# Patient Record
Sex: Female | Born: 1985 | Race: Black or African American | Hispanic: No | Marital: Single
Health system: Southern US, Community
[De-identification: ages and names within clinical notes are randomized; demographics above are authoritative.]

## PROBLEM LIST (undated history)

## (undated) DIAGNOSIS — J302 Other seasonal allergic rhinitis: Secondary | ICD-10-CM

---

## 2015-08-21 ENCOUNTER — Encounter (HOSPITAL_COMMUNITY): Payer: Self-pay | Admitting: *Deleted

## 2015-08-21 ENCOUNTER — Emergency Department (HOSPITAL_COMMUNITY): Payer: BC Managed Care – PPO

## 2015-08-21 ENCOUNTER — Emergency Department (HOSPITAL_COMMUNITY)
Admission: EM | Admit: 2015-08-21 | Discharge: 2015-08-21 | Disposition: A | Discharge status: Home / Self Care | Payer: BC Managed Care – PPO | Attending: Emergency Medicine | Admitting: Emergency Medicine

## 2015-08-21 DIAGNOSIS — R109 Unspecified abdominal pain: Secondary | ICD-10-CM | POA: Diagnosis present

## 2015-08-21 DIAGNOSIS — N200 Calculus of kidney: Secondary | ICD-10-CM | POA: Insufficient documentation

## 2015-08-21 DIAGNOSIS — N23 Unspecified renal colic: Secondary | ICD-10-CM | POA: Diagnosis not present

## 2015-08-21 DIAGNOSIS — Z79899 Other long term (current) drug therapy: Secondary | ICD-10-CM | POA: Insufficient documentation

## 2015-08-21 DIAGNOSIS — R112 Nausea with vomiting, unspecified: Secondary | ICD-10-CM | POA: Diagnosis not present

## 2015-08-21 HISTORY — DX: Other seasonal allergic rhinitis: J30.2

## 2015-08-21 LAB — BASIC METABOLIC PANEL
Anion gap: 5 (ref 5–15)
BUN: 11 mg/dL (ref 6–20)
CALCIUM: 8.3 mg/dL — AB (ref 8.9–10.3)
CHLORIDE: 108 mmol/L (ref 101–111)
CO2: 22 mmol/L (ref 22–32)
CREATININE: 0.78 mg/dL (ref 0.44–1.00)
GFR calc non Af Amer: >60 mL/min (ref >60)
Glucose, Bld: 122 mg/dL — ABNORMAL HIGH (ref 65–99)
Potassium: 3.9 mmol/L (ref 3.5–5.1)
Sodium: 135 mmol/L (ref 135–145)

## 2015-08-21 LAB — CBC WITH DIFFERENTIAL/PLATELET
BASOS ABS: 0 10*3/uL (ref 0.0–0.1)
Basophils Relative: 0 %
EOS PCT: 0 %
Eosinophils Absolute: 0 10*3/uL (ref 0.0–0.7)
HCT: 40.5 % (ref 36.0–46.0)
Hemoglobin: 13.8 g/dL (ref 12.0–15.0)
LYMPHS PCT: 8 %
Lymphs Abs: 0.6 10*3/uL — ABNORMAL LOW (ref 0.7–4.0)
MCH: 30.2 pg (ref 26.0–34.0)
MCHC: 34.1 g/dL (ref 30.0–36.0)
MCV: 88.6 fL (ref 78.0–100.0)
Monocytes Absolute: 0.3 10*3/uL (ref 0.1–1.0)
Monocytes Relative: 4 %
Neutro Abs: 6.4 10*3/uL (ref 1.7–7.7)
Neutrophils Relative %: 88 %
Platelets: 282 10*3/uL (ref 150–400)
RBC: 4.57 MIL/uL (ref 3.87–5.11)
RDW: 12.8 % (ref 11.5–15.5)
WBC: 7.3 10*3/uL (ref 4.0–10.5)

## 2015-08-21 LAB — URINALYSIS, ROUTINE W REFLEX MICROSCOPIC
BILIRUBIN URINE: NEGATIVE
GLUCOSE, UA: NEGATIVE mg/dL
KETONES UR: NEGATIVE mg/dL
Leukocytes, UA: NEGATIVE
Nitrite: NEGATIVE
PH: 5.5 (ref 5.0–8.0)
Protein, ur: NEGATIVE mg/dL
SPECIFIC GRAVITY, URINE: 1.019 (ref 1.005–1.030)

## 2015-08-21 LAB — URINE MICROSCOPIC-ADD ON: WBC, UA: NONE SEEN WBC/hpf (ref 0–5)

## 2015-08-21 LAB — I-STAT BETA HCG BLOOD, ED (MC, WL, AP ONLY): I-stat hCG, quantitative: <5 m[IU]/mL (ref <5)

## 2015-08-21 MED ORDER — OXYCODONE-ACETAMINOPHEN 5-325 MG PO TABS: 1.0000 | ORAL_TABLET | ORAL | 0 refills | Status: AC | PRN

## 2015-08-21 MED ORDER — MORPHINE SULFATE (PF) 4 MG/ML IV SOLN
4.0000 mg | Freq: Once | INTRAVENOUS | Status: AC
  Administered 2015-08-21: 4 mg via INTRAVENOUS
  Filled 2015-08-21: qty 1

## 2015-08-21 MED ORDER — NAPROXEN 500 MG PO TABS: 500.0000 mg | ORAL_TABLET | Freq: Two times a day (BID) | ORAL | 0 refills | Status: AC | PRN

## 2015-08-21 MED ORDER — KETOROLAC TROMETHAMINE 15 MG/ML IJ SOLN
15.0000 mg | Freq: Once | INTRAMUSCULAR | Status: AC
  Administered 2015-08-21: 15 mg via INTRAVENOUS
  Filled 2015-08-21: qty 1

## 2015-08-21 MED ORDER — SODIUM CHLORIDE 0.9 % IV BOLUS (SEPSIS)
1000.0000 mL | Freq: Once | INTRAVENOUS | Status: AC
  Administered 2015-08-21: 1000 mL via INTRAVENOUS

## 2015-08-21 MED ORDER — ONDANSETRON HCL 4 MG PO TABS: 4.0000 mg | ORAL_TABLET | Freq: Three times a day (TID) | ORAL | 0 refills | Status: AC | PRN

## 2015-08-21 MED ORDER — OXYCODONE-ACETAMINOPHEN 5-325 MG PO TABS: 2.0000 | ORAL_TABLET | Freq: Once | ORAL | Status: DC

## 2015-08-21 MED ORDER — ONDANSETRON HCL 4 MG/2ML IJ SOLN
4.0000 mg | Freq: Once | INTRAMUSCULAR | Status: AC
  Administered 2015-08-21: 4 mg via INTRAVENOUS
  Filled 2015-08-21: qty 2

## 2015-08-21 MED ORDER — MORPHINE SULFATE (PF) 4 MG/ML IV SOLN
4.0000 mg | Freq: Once | INTRAVENOUS | Status: AC
Start: 2015-08-21 — End: 2015-08-21
  Administered 2015-08-21: 4 mg via INTRAVENOUS
  Filled 2015-08-21: qty 1

## 2015-08-21 NOTE — ED Notes (Signed)
Patient c/o acute onset left flank pain which began this morning.  Patient also has N/V with pain and hematuria.  Patient has hx of one previous kidney stone with similar pain and N/V.  No CVA tenderness, abdomen soft and non-tender to palpation.  Patient appears adequately hydrated.

## 2015-08-21 NOTE — ED Notes (Signed)
Only 1 dose of 4 mg Morphine given.  Morphine syringe tip blew off as med was being pushed and was wasted on bed for first administration.

## 2015-08-21 NOTE — ED Triage Notes (Signed)
Per EMS - patient comes from [facility] on [address] with c/o left flank pain of acute onset at 6 am today.  Patient also complains of intermittent dizziness and hematuria.  Patient has had some nausea, but no vomiting or diarrhea.  Patient is also on her menstrual cycle.  Patient received 4 mg of Zofran en route and 100 mcg of Fentanyl en route. Patient's vitals 118/74, HR 45-50 (patient is athletic), 99% on RA.  CBG 143.

## 2015-08-21 NOTE — ED Notes (Signed)
Bed: GD92 Expected date:  Expected time:  Means of arrival:  Comments: 30 yo flank pain

## 2015-08-21 NOTE — ED Provider Notes (Signed)
[facility]-EMERGENCY DEPT Provider Note   CSN: [id number] Arrival date & time: 08/21/15  1056  First Provider Contact:  None       History   Chief Complaint Chief Complaint  Patient presents with  . Flank Pain    HPI Robin Shannon is a 30 y.o. female.  HPI 30 year old female with past medical history of nephrolithiasis who presents with acute onset of left flank pain. The patient states she was in her usual state of health until earlier today when she began to have gradual onset and progressively worsening left flank pain. She describes the pain as an aching, throbbing gripping sensation localized to her left flank. She does feels that the pain has moved slightly down to her left lower quadrant. She has had associated nausea and vomiting. She's had no diarrhea. No fevers or chills. She has been reportedly unable tolerate by mouth due to pain throughout the day today. She is not taking anything for this.  Past Medical History:  Diagnosis Date  . Seasonal allergies     There are no active problems to display for this patient.   History reviewed. No pertinent surgical history.  OB History    No data available       Home Medications    Prior to Admission medications   Medication Sig Start Date End Date Taking? Authorizing Provider  fluticasone (FLONASE) 50 MCG/ACT nasal spray Place 1 spray into both nostrils daily as needed for allergies or rhinitis.   Yes Historical Provider, MD  naproxen sodium (ANAPROX) 220 MG tablet Take 220 mg by mouth 2 (two) times daily as needed (pain).   Yes Historical Provider, MD  norethindrone-ethinyl estradiol (JUNEL FE,GILDESS FE,LOESTRIN FE) 1-20 MG-MCG tablet Take 1 tablet by mouth daily.   Yes Historical Provider, MD  naproxen (NAPROSYN) 500 MG tablet Take 1 tablet (500 mg total) by mouth 2 (two) times daily as needed for moderate pain. 08/21/15   Shaune Pollack, MD  ondansetron (ZOFRAN) 4 MG tablet Take 1 tablet (4 mg total) by mouth every 8  (eight) hours as needed for nausea or vomiting. 08/21/15   Shaune Pollack, MD  oxyCODONE-acetaminophen (PERCOCET/ROXICET) 5-325 MG tablet Take 1 tablet by mouth every 4 (four) hours as needed for severe pain. 08/21/15   Shaune Pollack, MD    Family History No family history on file.  Social History Social History  Substance Use Topics  . Smoking status: Not on file  . Smokeless tobacco: Not on file  . Alcohol use Not on file     Allergies   Review of patient's allergies indicates no known allergies.   Review of Systems Review of Systems  Constitutional: Negative for chills, fatigue and fever.  HENT: Negative for congestion and rhinorrhea.   Eyes: Negative for visual disturbance.  Respiratory: Negative for cough, shortness of breath and wheezing.   Cardiovascular: Negative for chest pain and leg swelling.  Gastrointestinal: Positive for abdominal pain, nausea and vomiting. Negative for diarrhea.  Genitourinary: Positive for flank pain. Negative for dysuria, hematuria, vaginal bleeding, vaginal discharge and vaginal pain.  Musculoskeletal: Negative for neck pain.  Skin: Negative for rash.  Allergic/Immunologic: Negative for immunocompromised state.  Neurological: Negative for syncope and weakness.     Physical Exam Updated Vital Signs BP 91/60 (BP Location: Right Arm)   Pulse (!) 57   Temp 98.8 F (37.1 C) (Oral)   Resp 17   SpO2 100%   Physical Exam  Constitutional: She appears well-developed and well-nourished. She appears  distressed (appears uncomfortable).  HENT:  Head: Normocephalic.  Mouth/Throat: Oropharynx is clear and moist. No oropharyngeal exudate.  Eyes: Conjunctivae are normal. Pupils are equal, round, and reactive to light.  Neck: Neck supple.  Cardiovascular: Normal rate, regular rhythm and normal heart sounds.  Exam reveals no friction rub.   No murmur heard. Pulmonary/Chest: Effort normal and breath sounds normal. No respiratory distress. She has no  wheezes. She has no rales.  Abdominal: Soft. Bowel sounds are normal. She exhibits no distension. There is no tenderness. There is no rebound.  No overt CVAT bilaterally  Musculoskeletal: She exhibits no edema.  Neurological: She is alert. She exhibits normal muscle tone.  Skin: Skin is warm. Capillary refill takes less than 2 seconds.  Nursing note and vitals reviewed.    ED Treatments / Results  Labs (all labs ordered are listed, but only abnormal results are displayed) Labs Reviewed  CBC WITH DIFFERENTIAL/PLATELET - Abnormal; Notable for the following:       Result Value   Lymphs Abs 0.6 (*)    All other components within normal limits  BASIC METABOLIC PANEL - Abnormal; Notable for the following:    Glucose, Bld 122 (*)    Calcium 8.3 (*)    All other components within normal limits  URINALYSIS, ROUTINE W REFLEX MICROSCOPIC (NOT AT [facility]) - Abnormal; Notable for the following:    Hgb urine dipstick LARGE (*)    All other components within normal limits  URINE MICROSCOPIC-ADD ON - Abnormal; Notable for the following:    Squamous Epithelial / LPF 0-5 (*)    Bacteria, UA FEW (*)    All other components within normal limits  URINE CULTURE  I-STAT BETA HCG BLOOD, ED ([facility], [facility], [facility] ONLY)    EKG  EKG Interpretation None       Radiology US Renal  Result Date: 08/21/2015 CLINICAL DATA:  Left flank pain and hematuria. EXAM: RENAL / URINARY TRACT ULTRASOUND COMPLETE COMPARISON:  None. FINDINGS: Right Kidney: Length: 11.8 cm. Echogenicity within normal limits. No mass or hydronephrosis visualized. Left Kidney: Length: 12.4 cm. Echogenicity within normal limits. No mass or hydronephrosis visualized. Bladder: Incompletely visualized due to minimal distention. IMPRESSION: Normal renal ultrasound. Electronically Signed   By: Ted Mcalpine M.D.   On: 08/21/2015 13:53   Procedures Procedures (including critical care time)  Medications Ordered in ED Medications  sodium chloride  0.9 % bolus 1,000 mL (0 mLs Intravenous Stopped 08/21/15 1417)  morphine 4 MG/ML injection 4 mg (4 mg Intravenous Given 08/21/15 1218)  ondansetron (ZOFRAN) injection 4 mg (4 mg Intravenous Given 08/21/15 1218)  morphine 4 MG/ML injection 4 mg (4 mg Intravenous Given 08/21/15 1227)  ketorolac (TORADOL) 15 MG/ML injection 15 mg (15 mg Intravenous Given 08/21/15 1310)     Initial Impression / Assessment and Plan / ED Course  I have reviewed the triage vital signs and the nursing notes.  Pertinent labs & imaging results that were available during my care of the patient were reviewed by me and considered in my medical decision making (see chart for details).  30 year old female with past medical history of recurrent nephrolithiasis who presents with acute onset of left flank pain. On arrival, vital signs are stable and within normal limits. She appears in moderate distress with positional improvement in her pain. Urinalysis shows gross hematuria. Primary suspicion is recurrent nephrolithiasis with left-sided renal colic. BMP shows baseline renal function. She has no fevers, chills, pyuria, or evidence of urinary infection. Do not  suspect infected stone. Patient has had unremarkable CT imaging in the past with no anatomic abnormalities. Ultrasound subsequently obtained shows no hydronephrosis. Given baseline renal function and resolution and improvement in pain, will discharge with outpatient pain management. Return precautions given. She is otherwise well with no abdominal TTP, leukocytosis, anorexia, diarrhea, or sx to suggest appendicitis, cholecystitis, or diverticulitis. She has had no vaginal discharge, bleeding, or symptoms to suggest PID, cervicitis, TOA, or ovarian torsion. Patient tolerating by mouth without difficulty  Final Clinical Impressions(s) / ED Diagnoses   Final diagnoses:  Renal colic on left side  Kidney stone    New Prescriptions Discharge Medication List as of 08/21/2015  2:29 PM      START taking these medications   Details  naproxen (NAPROSYN) 500 MG tablet Take 1 tablet (500 mg total) by mouth 2 (two) times daily as needed for moderate pain., Starting Sun 08/21/2015, Print    ondansetron (ZOFRAN) 4 MG tablet Take 1 tablet (4 mg total) by mouth every 8 (eight) hours as needed for nausea or vomiting., Starting Sun 08/21/2015, Print    oxyCODONE-acetaminophen (PERCOCET/ROXICET) 5-325 MG tablet Take 1 tablet by mouth every 4 (four) hours as needed for severe pain., Starting Sun 08/21/2015, Print         Shaune Pollack, MD 08/22/15 [phone]

## 2015-08-22 LAB — URINE CULTURE

## 2018-02-24 IMAGING — US US RENAL
1 series · 14 of 25 positions shown · non-contrast
Comparison: None.

CLINICAL DATA: Left flank pain and hematuria.

EXAM:
RENAL / URINARY TRACT ULTRASOUND COMPLETE

[Series 1: us renal · 0.22mm/px · 14 of 42 slices shown]
[im 1/42]
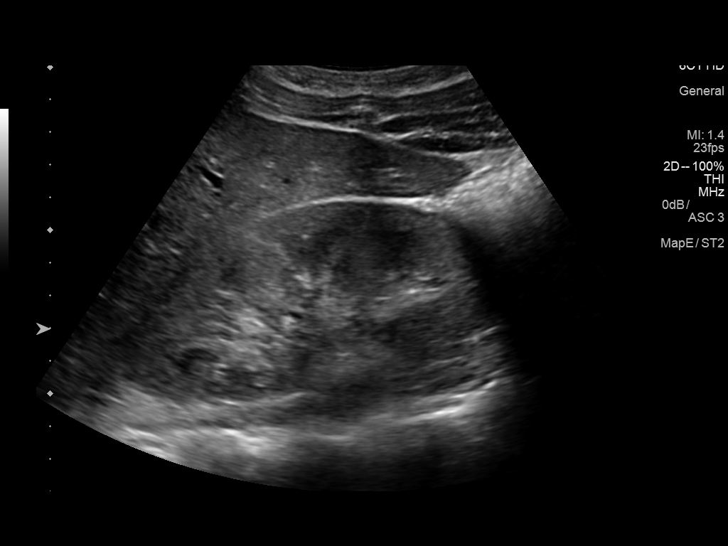
[im 4/42]
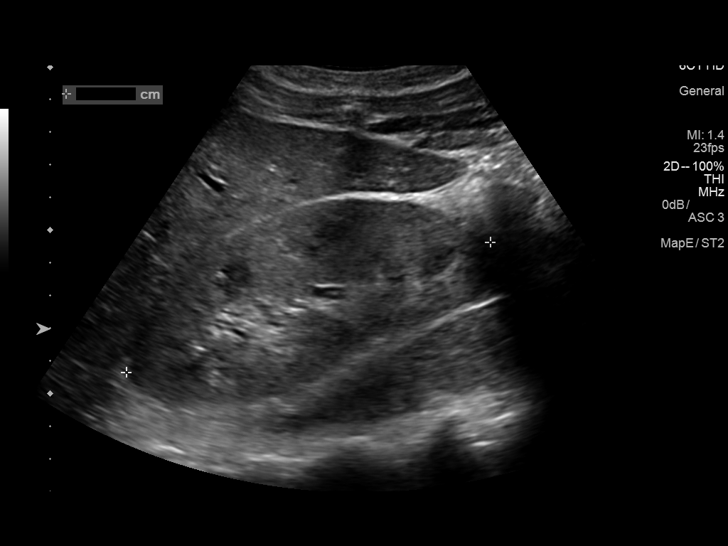
[im 7/42]
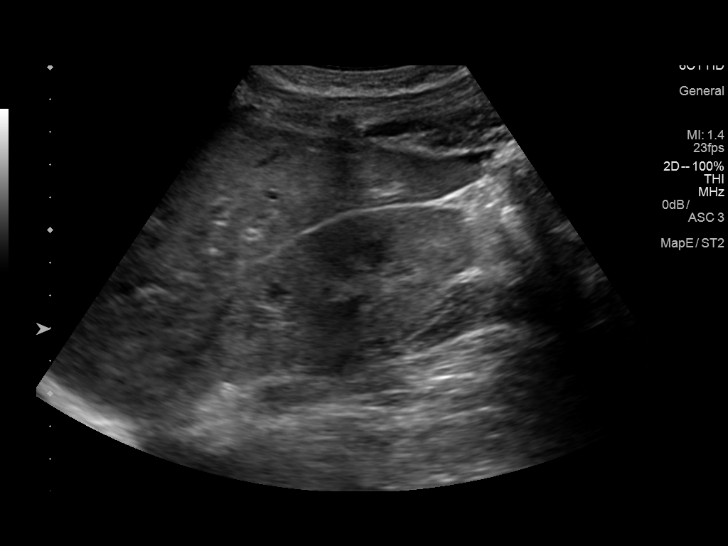
[im 11/42]
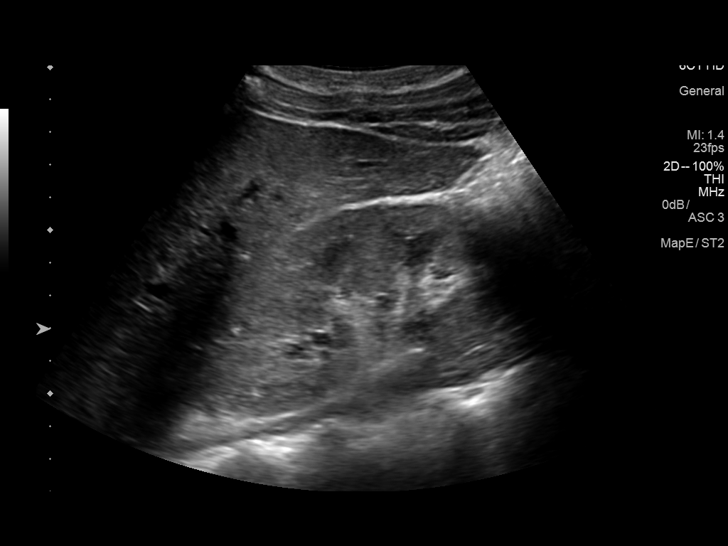
[im 14/42]
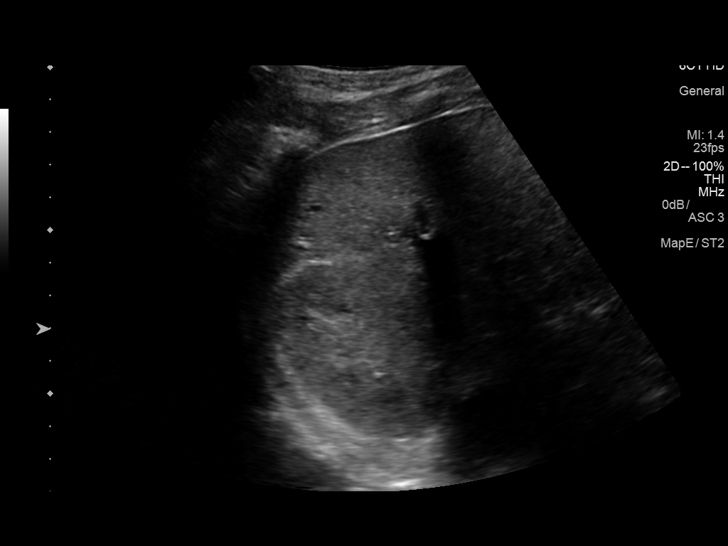
[im 16/42]
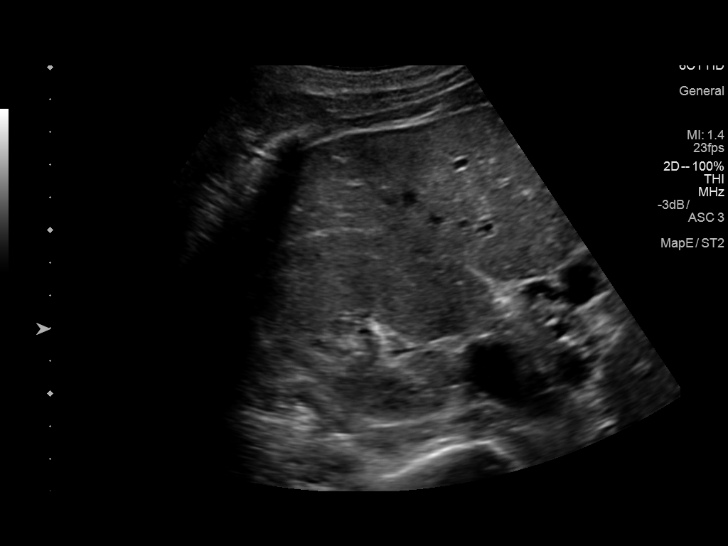
[im 19/42]
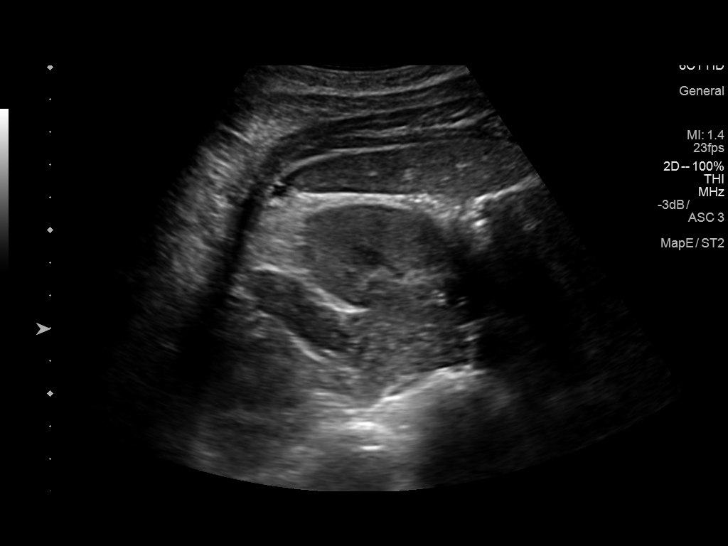
[im 23/42]
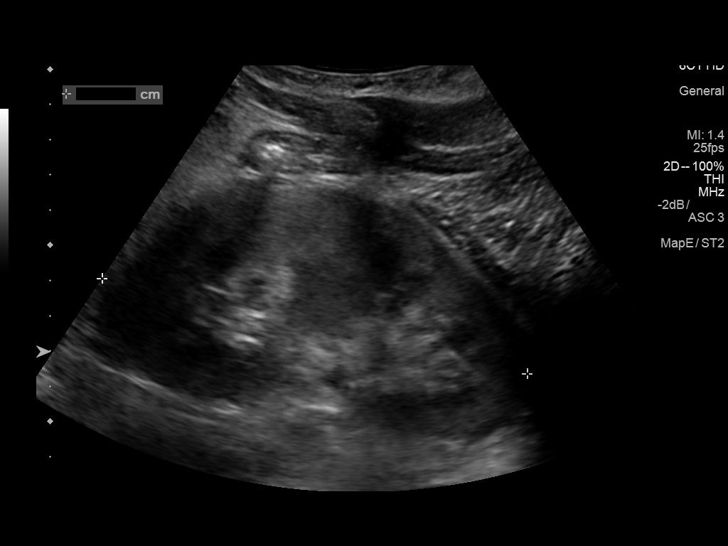
[im 26/42]
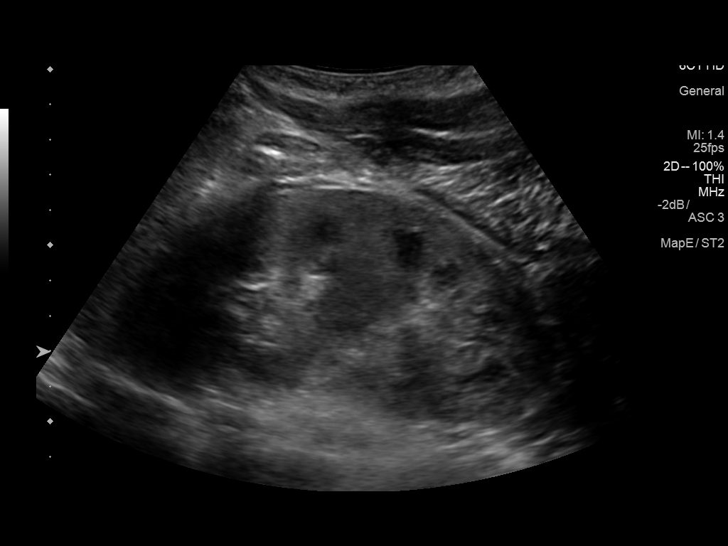
[im 28/42]
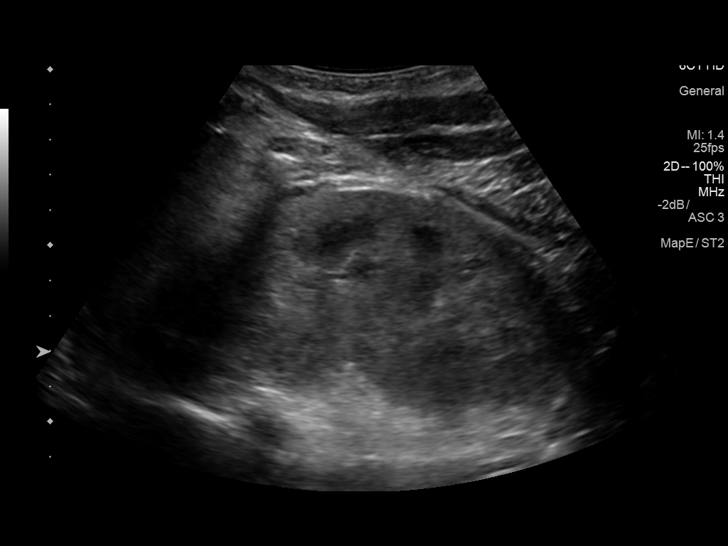
[im 31/42]
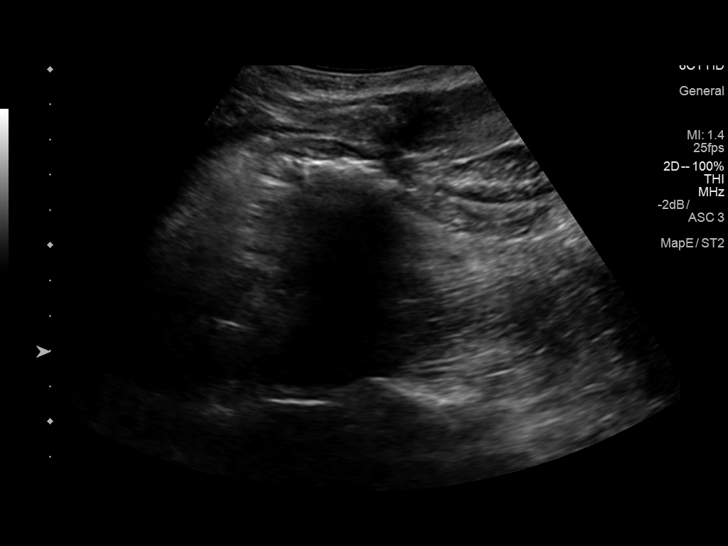
[im 35/42]
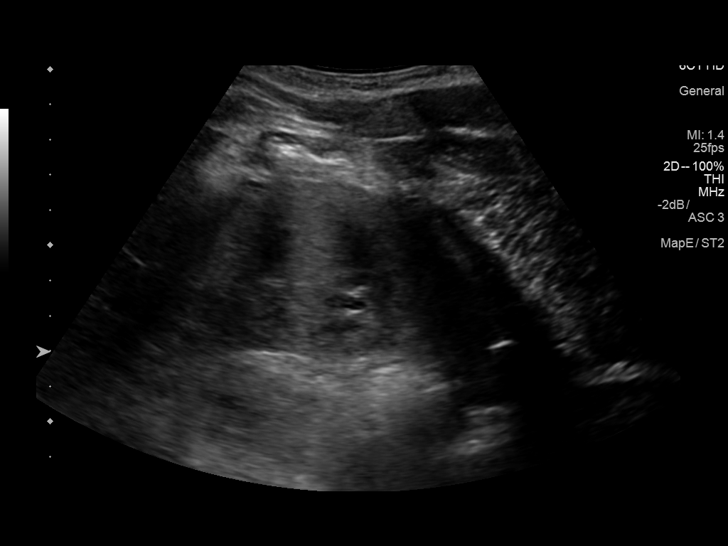
[im 38/42]
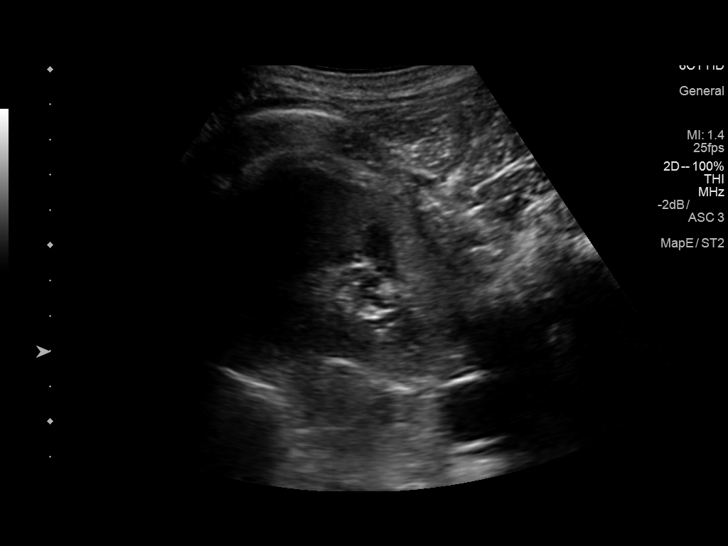
[im 42/42]
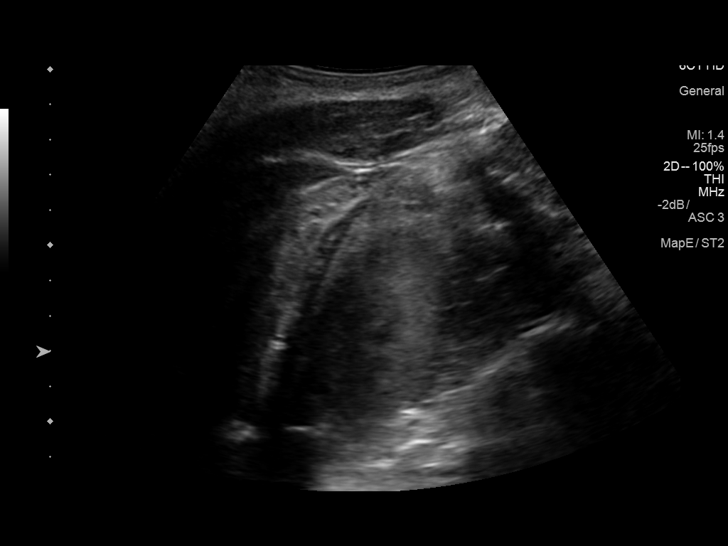

[14 of 25 positions shown; findings below may reference images not displayed]

FINDINGS: Right Kidney:

Length: 11.8 cm. Echogenicity within normal limits. No mass or
hydronephrosis visualized.

Left Kidney:

Length: 12.4 cm. Echogenicity within normal limits. No mass or
hydronephrosis visualized.

Bladder:

Incompletely visualized due to minimal distention.
IMPRESSION: Normal renal ultrasound.
